# Patient Record
Sex: Female | Born: 1980 | Race: White | Hispanic: No | Marital: Married | Smoking: Never smoker
Health system: Southern US, Community
[De-identification: ages and names within clinical notes are randomized; demographics above are authoritative.]

## PROBLEM LIST (undated history)

## (undated) ENCOUNTER — Inpatient Hospital Stay (HOSPITAL_COMMUNITY): Payer: Self-pay

## (undated) DIAGNOSIS — Z789 Other specified health status: Secondary | ICD-10-CM

## (undated) HISTORY — PX: DILATION AND CURETTAGE OF UTERUS: SHX78

## (undated) HISTORY — PX: TONSILLECTOMY: SUR1361

---

## 2012-04-04 ENCOUNTER — Inpatient Hospital Stay (HOSPITAL_COMMUNITY): Payer: Self-pay

## 2012-04-04 ENCOUNTER — Inpatient Hospital Stay (HOSPITAL_COMMUNITY)
Admission: AD | Admit: 2012-04-04 | Discharge: 2012-04-04 | Disposition: A | Discharge status: Home / Self Care | Payer: Self-pay | Source: Ambulatory Visit | Attending: Obstetrics & Gynecology | Admitting: Obstetrics & Gynecology

## 2012-04-04 ENCOUNTER — Encounter (HOSPITAL_COMMUNITY): Payer: Self-pay | Admitting: *Deleted

## 2012-04-04 DIAGNOSIS — O209 Hemorrhage in early pregnancy, unspecified: Secondary | ICD-10-CM | POA: Insufficient documentation

## 2012-04-04 DIAGNOSIS — O4100X0 Oligohydramnios, unspecified trimester, not applicable or unspecified: Secondary | ICD-10-CM

## 2012-04-04 LAB — CBC
HCT: 38.9 % (ref 36.0–46.0)
MCHC: 33.7 g/dL (ref 30.0–36.0)
RDW: 12.9 % (ref 11.5–15.5)

## 2012-04-04 NOTE — MAU Note (Signed)
PT SAYS AT [facility]-  HER CHILD CAME IN ROOM -   THEN PT WENT TO B-ROOM  THEN SHE HAD GUSH OF FLUID IN TOILET.  THEN BLEEDING STARTED AT  0030-.    THEN CRAMPING STARTED  AT  0100.    IN ROOM - ON PAD - DARK RED. SMALL AMT

## 2012-04-04 NOTE — MAU Note (Signed)
Leaking fld since 2400. Then started bleeding with some clots. Slight cramping since leaking started

## 2012-04-04 NOTE — MAU Provider Note (Signed)
History     CSN: [id number]  Arrival date and time: 04/04/12 0100   First Provider Initiated Contact with Patient 04/04/12 0150      Chief Complaint  Patient presents with  . Rupture of Membranes  . Vaginal Bleeding   HPI  Pt is a G4P0121 at 18.3 wks IUP here with report of a gush of fluid noted while in bed.  Went to sit on toilet and experienced vaginal bleeding.  Reports passing clots.  No report of pelvic pain.    Past Medical History  Diagnosis Date  . Preterm labor     Past Surgical History  Procedure Date  . Tonsillectomy   . Dilation and curettage of uterus     History reviewed. No pertinent family history.  History  Substance Use Topics  . Smoking status: Never Smoker   . Smokeless tobacco: Not on file  . Alcohol Use: No    Allergies: No Known Allergies  Prescriptions prior to admission  Medication Sig Dispense Refill  . Multiple Vitamin (MULTIVITAMIN) tablet Take 1 tablet by mouth daily.        Review of Systems  Genitourinary:       Vaginal bleeding; leaking of fluid  All other systems reviewed and are negative.   Physical Exam   Filed Vitals:   04/04/12 0118 04/04/12 0353  BP: 150/82 123/73  Pulse: 99 88  Temp: 97.9 F (36.6 C)   Resp: 22   Height: 5\' 10"  (1.778 m)   Weight: 109.68 kg (241 lb 12.8 oz)     Blood pressure 150/82, pulse 99, temperature 97.9 F (36.6 C), resp. rate 22, height 5\' 10"  (1.778 m), weight 109.68 kg (241 lb 12.8 oz).  Physical Exam  Constitutional: She is oriented to person, place, and time. She appears well-developed and well-nourished. No distress.       Uncomfortable appearing  HENT:  Head: Normocephalic.  Neck: Normal range of motion. Neck supple.  Cardiovascular: Normal rate, regular rhythm and normal heart sounds.   Respiratory: Effort normal and breath sounds normal. No respiratory distress.  GI: Soft. She exhibits no mass. There is no tenderness. There is no rebound and no guarding.    Genitourinary: There is bleeding (moderate; +clots) around the vagina.  Musculoskeletal: Normal range of motion. She exhibits no edema.  Neurological: She is alert and oriented to person, place, and time.  Skin: Skin is warm and dry.    MAU Course  Procedures Results for orders placed during the hospital encounter of 04/04/12 (from the past 24 hour(s))  CBC     Status: Normal   Collection Time   04/04/12  2:30 AM      Component Value Range   WBC 8.2  4.0 - 10.5 K/uL   RBC 4.29  3.87 - 5.11 MIL/uL   Hemoglobin 13.1  12.0 - 15.0 g/dL   HCT 40.9  81.1 - 91.4 %   MCV 90.7  78.0 - 100.0 fL   MCH 30.5  26.0 - 34.0 pg   MCHC 33.7  30.0 - 36.0 g/dL   RDW 78.2  95.6 - 21.3 %   Platelets 178  150 - 400 K/uL  ABO/RH     Status: Normal (Preliminary result)   Collection Time   04/04/12  2:30 AM      Component Value Range   ABO/RH(D) O POS     Ultrasound: IMPRESSION: Anhydramnios noted, compatible with premature rupture of membranes. No evidence for placental abruption. The biparietal diameter  of 4.1 cm corresponds to a gestational age of [redacted] weeks 3 days.  Consulted with Dr. Arlyce Dice > Reviewed HPI/exam/ultrasound results > discussed potential outcome with pt via phone > DC pt home with follow-up either tomorrow or Monday in office.  Assessment and Plan  Anhydramnios in Pregnancy Vaginal Bleeding in Pregnancy  Plan: DC to home Pt will consult with husband Banker) and will decide when to return for plan of care. Bleeding precautions.  [facility] 04/04/2012, 1:57 AM

## 2012-04-05 ENCOUNTER — Encounter (HOSPITAL_COMMUNITY): Payer: Self-pay | Admitting: *Deleted

## 2012-04-05 ENCOUNTER — Ambulatory Visit (HOSPITAL_COMMUNITY)
Admission: RE | Admit: 2012-04-05 | Discharge: 2012-04-05 | Disposition: A | Discharge status: Home / Self Care | Payer: Self-pay | Source: Ambulatory Visit | Attending: Obstetrics and Gynecology | Admitting: Obstetrics and Gynecology

## 2012-04-05 ENCOUNTER — Other Ambulatory Visit (HOSPITAL_COMMUNITY): Payer: Self-pay | Admitting: Obstetrics and Gynecology

## 2012-04-05 ENCOUNTER — Encounter (HOSPITAL_COMMUNITY): Payer: Self-pay

## 2012-04-05 ENCOUNTER — Other Ambulatory Visit: Payer: Self-pay | Admitting: Obstetrics and Gynecology

## 2012-04-05 DIAGNOSIS — O4290 Premature rupture of membranes, unspecified as to length of time between rupture and onset of labor, unspecified weeks of gestation: Secondary | ICD-10-CM | POA: Insufficient documentation

## 2012-04-05 DIAGNOSIS — O2620 Pregnancy care for patient with recurrent pregnancy loss, unspecified trimester: Secondary | ICD-10-CM | POA: Insufficient documentation

## 2012-04-05 DIAGNOSIS — O3680X0 Pregnancy with inconclusive fetal viability, not applicable or unspecified: Secondary | ICD-10-CM

## 2012-04-05 DIAGNOSIS — Z8751 Personal history of pre-term labor: Secondary | ICD-10-CM | POA: Insufficient documentation

## 2012-04-05 NOTE — Consult Note (Signed)
MFM Note  Jamie Gregory is a 32 year old G21P1A2 Caucasian female at 18+weeks who presents for consultation due to spontaneous rupture of membranes which occurred yesterday. She reports feeling a "gush" of fluid with some minimal vaginal bleeding. She presented to the MAU for evaluation and PPROM was confirmed. US revealed no measurable pocket of fluid. She was discharged home and has had no s/s of intrauterine infection.  We reviewed her OB history which is significant for one delivery at 35 weeks after SROM. She also has had 2 early SABs with D&Cs.  The management of midterm PPROM was discussed in detail. Her options at this time would be expectant management verses termination. If she chooses expectant management, she could be followed at home with strict precautions for the s/s of infection. At viability, she would be admitted for closer observation, BMZ and antibiotics. Delivery is recommended at 34 weeks. Fetal risks include pulmonary hypoplasia, skeletal deformities and prematurity. Maternal risks include intrauterine infection, sepsis and death.  After our discussion, an ultrasound was performed which revealed a fetal demise. Induction and D&E were reviewed including risks and benefits of both. Jamie Gregory needed more time before she made a final decision. She was given our contact numbers.  Thank you for allowing me to be apart of her prenatal care.  (Face-to-face consultation with patient: 30 min)

## 2012-04-06 ENCOUNTER — Ambulatory Visit (HOSPITAL_COMMUNITY)
Admission: RE | Admit: 2012-04-06 | Discharge: 2012-04-06 | Disposition: A | Discharge status: Home / Self Care | Payer: 59 | Source: Ambulatory Visit | Attending: Obstetrics and Gynecology | Admitting: Obstetrics and Gynecology

## 2012-04-06 ENCOUNTER — Encounter (HOSPITAL_COMMUNITY)
Admission: RE | Disposition: A | Discharge status: Home / Self Care | Payer: Self-pay | Source: Ambulatory Visit | Attending: Obstetrics and Gynecology

## 2012-04-06 ENCOUNTER — Encounter (HOSPITAL_COMMUNITY): Payer: Self-pay | Admitting: Anesthesiology

## 2012-04-06 ENCOUNTER — Ambulatory Visit (HOSPITAL_COMMUNITY): Payer: 59 | Admitting: Anesthesiology

## 2012-04-06 ENCOUNTER — Encounter (HOSPITAL_COMMUNITY): Payer: Self-pay | Admitting: Pharmacy Technician

## 2012-04-06 DIAGNOSIS — IMO0002 Reserved for concepts with insufficient information to code with codable children: Secondary | ICD-10-CM

## 2012-04-06 DIAGNOSIS — O021 Missed abortion: Secondary | ICD-10-CM | POA: Insufficient documentation

## 2012-04-06 HISTORY — DX: Other specified health status: Z78.9

## 2012-04-06 HISTORY — PX: DILATION AND EVACUATION: SHX1459

## 2012-04-06 SURGERY — DILATION AND EVACUATION, UTERUS, SECOND TRIMESTER
Anesthesia: General | Site: Vagina | Wound class: Clean Contaminated

## 2012-04-06 MED ORDER — PROPOFOL 10 MG/ML IV EMUL
INTRAVENOUS | Status: AC
  Filled 2012-04-06: qty 20

## 2012-04-06 MED ORDER — MISOPROSTOL 200 MCG PO TABS
ORAL_TABLET | ORAL | Status: AC
  Filled 2012-04-06: qty 4

## 2012-04-06 MED ORDER — OXYCODONE-ACETAMINOPHEN 5-325 MG PO TABS: 1.0000 | ORAL_TABLET | ORAL | Status: DC | PRN

## 2012-04-06 MED ORDER — CEFAZOLIN SODIUM-DEXTROSE 2-3 GM-% IV SOLR
INTRAVENOUS | Status: AC
  Filled 2012-04-06: qty 50

## 2012-04-06 MED ORDER — OXYCODONE-ACETAMINOPHEN 5-325 MG PO TABS
ORAL_TABLET | ORAL | Status: AC
  Administered 2012-04-06: 1 via ORAL
  Filled 2012-04-06: qty 1

## 2012-04-06 MED ORDER — PROPOFOL 10 MG/ML IV EMUL
INTRAVENOUS | Status: DC | PRN
  Administered 2012-04-06: 200 mg via INTRAVENOUS

## 2012-04-06 MED ORDER — MIDAZOLAM HCL 2 MG/2ML IJ SOLN
INTRAMUSCULAR | Status: AC
  Filled 2012-04-06: qty 2

## 2012-04-06 MED ORDER — MIDAZOLAM HCL 5 MG/5ML IJ SOLN
INTRAMUSCULAR | Status: DC | PRN
  Administered 2012-04-06: 2 mg via INTRAVENOUS

## 2012-04-06 MED ORDER — OXYCODONE-ACETAMINOPHEN 5-325 MG PO TABS
1.0000 | ORAL_TABLET | Freq: Once | ORAL | Status: AC
  Administered 2012-04-06: 1 via ORAL

## 2012-04-06 MED ORDER — LACTATED RINGERS IV SOLN
INTRAVENOUS | Status: DC
  Administered 2012-04-06 (×2): via INTRAVENOUS

## 2012-04-06 MED ORDER — METHYLERGONOVINE MALEATE 0.2 MG/ML IJ SOLN
INTRAMUSCULAR | Status: DC | PRN
  Administered 2012-04-06: 0.2 mg via INTRAMUSCULAR

## 2012-04-06 MED ORDER — DEXAMETHASONE SODIUM PHOSPHATE 4 MG/ML IJ SOLN
INTRAMUSCULAR | Status: DC | PRN
  Administered 2012-04-06: 5 mg via INTRAVENOUS

## 2012-04-06 MED ORDER — ONDANSETRON HCL 4 MG/2ML IJ SOLN
INTRAMUSCULAR | Status: DC | PRN
  Administered 2012-04-06: 4 mg via INTRAVENOUS

## 2012-04-06 MED ORDER — CEFAZOLIN SODIUM 1-5 GM-% IV SOLN
1.0000 g | INTRAVENOUS | Status: DC
  Filled 2012-04-06: qty 50

## 2012-04-06 MED ORDER — FENTANYL CITRATE 0.05 MG/ML IJ SOLN
INTRAMUSCULAR | Status: DC | PRN
  Administered 2012-04-06: 100 ug via INTRAVENOUS
  Administered 2012-04-06: 50 ug via INTRAVENOUS

## 2012-04-06 MED ORDER — METHYLERGONOVINE MALEATE 0.2 MG PO TABS: 0.2000 mg | ORAL_TABLET | Freq: Four times a day (QID) | ORAL | Status: DC

## 2012-04-06 MED ORDER — LIDOCAINE HCL (CARDIAC) 20 MG/ML IV SOLN
INTRAVENOUS | Status: DC | PRN
  Administered 2012-04-06: 80 mg via INTRAVENOUS

## 2012-04-06 MED ORDER — CEFAZOLIN SODIUM-DEXTROSE 2-3 GM-% IV SOLR
2.0000 g | Freq: Once | INTRAVENOUS | Status: AC
  Administered 2012-04-06: 2 g via INTRAVENOUS

## 2012-04-06 MED ORDER — DEXAMETHASONE SODIUM PHOSPHATE 10 MG/ML IJ SOLN
INTRAMUSCULAR | Status: AC
  Filled 2012-04-06: qty 1

## 2012-04-06 MED ORDER — FENTANYL CITRATE 0.05 MG/ML IJ SOLN
INTRAMUSCULAR | Status: AC
  Filled 2012-04-06: qty 5

## 2012-04-06 MED ORDER — LIDOCAINE HCL (CARDIAC) 20 MG/ML IV SOLN
INTRAVENOUS | Status: AC
  Filled 2012-04-06: qty 5

## 2012-04-06 MED ORDER — KETOROLAC TROMETHAMINE 30 MG/ML IJ SOLN
INTRAMUSCULAR | Status: AC
  Filled 2012-04-06: qty 1

## 2012-04-06 MED ORDER — ONDANSETRON HCL 4 MG/2ML IJ SOLN: INTRAMUSCULAR | Status: DC | PRN

## 2012-04-06 MED ORDER — KETOROLAC TROMETHAMINE 30 MG/ML IJ SOLN
INTRAMUSCULAR | Status: DC | PRN
  Administered 2012-04-06: 30 mg via INTRAVENOUS

## 2012-04-06 MED ORDER — MISOPROSTOL 200 MCG PO TABS
800.0000 ug | ORAL_TABLET | Freq: Once | ORAL | Status: AC
  Administered 2012-04-06: 800 ug via VAGINAL

## 2012-04-06 SURGICAL SUPPLY — 18 items
CLOTH BEACON ORANGE TIMEOUT ST (SAFETY) ×2 IMPLANT
CONT PATH 16OZ SNAP LID 3702 (MISCELLANEOUS) ×2 IMPLANT
DRAPE HYSTEROSCOPY (DRAPE) ×2 IMPLANT
GLOVE ECLIPSE 7.0 STRL STRAW (GLOVE) ×4 IMPLANT
GOWN PREVENTION PLUS XLARGE (GOWN DISPOSABLE) ×2 IMPLANT
GOWN STRL REIN XL XLG (GOWN DISPOSABLE) ×4 IMPLANT
KIT BERKELEY 2ND TRIMESTER 1/2 (COLLECTOR) ×2 IMPLANT
NEEDLE SPNL 22GX3.5 QUINCKE BK (NEEDLE) ×2 IMPLANT
NS IRRIG 1000ML POUR BTL (IV SOLUTION) ×2 IMPLANT
PACK VAGINAL MINOR WOMEN LF (CUSTOM PROCEDURE TRAY) ×2 IMPLANT
PAD OB MATERNITY 4.3X12.25 (PERSONAL CARE ITEMS) ×2 IMPLANT
PAD PREP 24X48 CUFFED NSTRL (MISCELLANEOUS) ×2 IMPLANT
SYR CONTROL 10ML LL (SYRINGE) ×2 IMPLANT
TOWEL OR 17X24 6PK STRL BLUE (TOWEL DISPOSABLE) ×4 IMPLANT
TUBE VACURETTE 2ND TRIMESTER (CANNULA) ×2 IMPLANT
VACURETTE 12 RIGID CVD (CANNULA) IMPLANT
VACURETTE 14MM CVD 1/2 BASE (CANNULA) ×2 IMPLANT
VACURETTE 16MM ASPIR CVD .5 (CANNULA) IMPLANT

## 2012-04-06 NOTE — H&P (Signed)
Pt is a 32 year old white female, [id number] who presents to the OR for a D&E secondary to PPROM with fetal demise. PE: VSSAF        ABD-soft, non tender        Pelvic- defered to the OR. IMP/ 18 week PPROM with fetal demise. PLAN/ D&E

## 2012-04-06 NOTE — Op Note (Signed)
Jamie Gregory, Jamie NO.:  Gregory  MEDICAL RECORD NO.:  [id number]  LOCATION:  [facility]                          FACILITY:  [facility]  PHYSICIAN:  Malva Limes, M.D.    DATE OF BIRTH:  Mar 18, 1980  DATE OF PROCEDURE:  04/06/2012 DATE OF DISCHARGE:  04/06/2012                              OPERATIVE REPORT   PREOPERATIVE DIAGNOSES: 1. Premature rupture of membranes 2. Fetal demise.  POSTOPERATIVE DIAGNOSES: 1. Premature rupture of membranes. 2. Fetal demise.  PROCEDURE:  Dilation evacuation.  SURGEON:  Malva Limes, M.D.  ANESTHESIA:  General with local antibiotics, Ancef 2 g.  DRAINS:  Red rubber catheter bladder.  SPECIMENS:  Products of conception sent to pathology, and for chromosomal analysis.  ESTIMATED BLOOD LOSS:  125 mL.  COMPLICATIONS:  None.  PROCEDURE IN DETAIL:  The patient was taken to the operating room.  She was placed in dorsal supine position and general anesthetic was administered without difficulty.  She was then placed in dorsal lithotomy position.  She was prepped and draped in usual fashion for this procedure.  Her bladder was drained with a red rubber catheter. The patient previously had a Cytotec placed in her vagina, the remaining pellet was removed.  An exam under anesthesia revealed the uterus approximately 16 weeks in size.  Sterile speculum placed in vagina. Single-tooth tenaculum was applied to the anterior cervical lip.  The cervix was serially dilated to a 47-French.  The long forceps were then used to remove the fetus and placenta.  Once this was accomplished, suction curettage was performed followed by sharp curettage and repeat suction.  There was minimal bleeding noted during this procedure.  The patient was taken to recovery room in stable condition.  Her blood type is Rh positive, and therefore, no RhoGAM is indicated.  We will await the results of a chromosome analysis.  The patient will be discharged home.  She  will be sent home with Methergine 0.2 mg p.o. q.6 h. x6 and Percocet to take p.r.n.  She will follow up in the office in 2 weeks.          ______________________________ Malva Limes, M.D.     MA/MEDQ  D:  04/06/2012  T:  04/06/2012  Job:  [id number]

## 2012-04-06 NOTE — Anesthesia Preprocedure Evaluation (Signed)
Anesthesia Evaluation  Patient identified by MRN, date of birth, ID band Patient awake    Reviewed: Allergy & Precautions, H&P , NPO status , Patient's Chart, lab work & pertinent test results  Airway Mallampati: II TM Distance: >3 FB Neck ROM: full    Dental No notable dental hx. (+) Teeth Intact   Pulmonary neg pulmonary ROS,          Cardiovascular negative cardio ROS      Neuro/Psych negative neurological ROS  negative psych ROS   GI/Hepatic negative GI ROS, Neg liver ROS,   Endo/Other  negative endocrine ROS  Renal/GU negative Renal ROS  negative genitourinary   Musculoskeletal negative musculoskeletal ROS (+)   Abdominal Normal abdominal exam  (+)   Peds negative pediatric ROS (+)  Hematology negative hematology ROS (+)   Anesthesia Other Findings   Reproductive/Obstetrics negative OB ROS                           Anesthesia Physical Anesthesia Plan  ASA: II  Anesthesia Plan: General   Post-op Pain Management:    Induction: Intravenous  Airway Management Planned: LMA  Additional Equipment:   Intra-op Plan:   Post-operative Plan:   Informed Consent: I have reviewed the patients History and Physical, chart, labs and discussed the procedure including the risks, benefits and alternatives for the proposed anesthesia with the patient or authorized representative who has indicated his/her understanding and acceptance.     Plan Discussed with: CRNA and Surgeon  Anesthesia Plan Comments:         Anesthesia Quick Evaluation

## 2012-04-06 NOTE — Transfer of Care (Signed)
Immediate Anesthesia Transfer of Care Note  Patient: Jamie Gregory  Procedure(s) Performed: Procedure(s) (LRB) with comments: DILATATION AND EVACUATION (D&E) 2ND TRIMESTER (N/A)  Patient Location: PACU  Anesthesia Type:General  Level of Consciousness: awake, alert  and oriented  Airway & Oxygen Therapy: Patient Spontanous Breathing and Patient connected to nasal cannula oxygen  Post-op Assessment: Report given to PACU RN and Post -op Vital signs reviewed and stable  Post vital signs: stable  Complications: No apparent anesthesia complications

## 2012-04-06 NOTE — Anesthesia Postprocedure Evaluation (Signed)
  Anesthesia Post-op Note  Patient: Jamie Gregory  Procedure(s) Performed: Procedure(s) (LRB) with comments: DILATATION AND EVACUATION (D&E) 2ND TRIMESTER (N/A)  Patient Location: PACU  Anesthesia Type:General  Level of Consciousness: awake, alert  and oriented  Airway and Oxygen Therapy: Patient Spontanous Breathing  Post-op Pain: none  Post-op Assessment: Post-op Vital signs reviewed, Patient's Cardiovascular Status Stable, Respiratory Function Stable, Patent Airway, No signs of Nausea or vomiting and Pain level controlled  Post-op Vital Signs: Reviewed and stable  Complications: No apparent anesthesia complications

## 2012-04-07 ENCOUNTER — Encounter (HOSPITAL_COMMUNITY): Payer: Self-pay | Admitting: Obstetrics and Gynecology

## 2012-09-14 ENCOUNTER — Telehealth (HOSPITAL_COMMUNITY): Payer: Self-pay | Admitting: *Deleted

## 2012-09-14 ENCOUNTER — Encounter (HOSPITAL_COMMUNITY): Payer: Self-pay | Admitting: *Deleted

## 2012-09-14 NOTE — Telephone Encounter (Signed)
Resolve episode 

## 2013-04-11 ENCOUNTER — Emergency Department (HOSPITAL_COMMUNITY)
Admission: EM | Admit: 2013-04-11 | Discharge: 2013-04-11 | Disposition: A | Discharge status: Home / Self Care | Payer: 59 | Attending: Emergency Medicine | Admitting: Emergency Medicine

## 2013-04-11 ENCOUNTER — Encounter (HOSPITAL_COMMUNITY): Payer: Self-pay | Admitting: Emergency Medicine

## 2013-04-11 DIAGNOSIS — Z8751 Personal history of pre-term labor: Secondary | ICD-10-CM | POA: Diagnosis not present

## 2013-04-11 DIAGNOSIS — S59919A Unspecified injury of unspecified forearm, initial encounter: Secondary | ICD-10-CM | POA: Diagnosis not present

## 2013-04-11 DIAGNOSIS — S59909A Unspecified injury of unspecified elbow, initial encounter: Secondary | ICD-10-CM | POA: Diagnosis not present

## 2013-04-11 DIAGNOSIS — Z3202 Encounter for pregnancy test, result negative: Secondary | ICD-10-CM | POA: Insufficient documentation

## 2013-04-11 DIAGNOSIS — Z79899 Other long term (current) drug therapy: Secondary | ICD-10-CM | POA: Insufficient documentation

## 2013-04-11 DIAGNOSIS — S4980XA Other specified injuries of shoulder and upper arm, unspecified arm, initial encounter: Secondary | ICD-10-CM | POA: Diagnosis not present

## 2013-04-11 DIAGNOSIS — IMO0002 Reserved for concepts with insufficient information to code with codable children: Secondary | ICD-10-CM | POA: Insufficient documentation

## 2013-04-11 DIAGNOSIS — S46909A Unspecified injury of unspecified muscle, fascia and tendon at shoulder and upper arm level, unspecified arm, initial encounter: Secondary | ICD-10-CM | POA: Insufficient documentation

## 2013-04-11 DIAGNOSIS — Y9389 Activity, other specified: Secondary | ICD-10-CM | POA: Diagnosis not present

## 2013-04-11 DIAGNOSIS — S0993XA Unspecified injury of face, initial encounter: Secondary | ICD-10-CM | POA: Diagnosis present

## 2013-04-11 DIAGNOSIS — S6990XA Unspecified injury of unspecified wrist, hand and finger(s), initial encounter: Secondary | ICD-10-CM | POA: Insufficient documentation

## 2013-04-11 DIAGNOSIS — T148XXA Other injury of unspecified body region, initial encounter: Secondary | ICD-10-CM

## 2013-04-11 DIAGNOSIS — S199XXA Unspecified injury of neck, initial encounter: Secondary | ICD-10-CM | POA: Diagnosis present

## 2013-04-11 DIAGNOSIS — Y92410 Unspecified street and highway as the place of occurrence of the external cause: Secondary | ICD-10-CM | POA: Insufficient documentation

## 2013-04-11 LAB — POCT PREGNANCY, URINE: PREG TEST UR: NEGATIVE

## 2013-04-11 MED ORDER — METAXALONE 800 MG PO TABS: 800.0000 mg | ORAL_TABLET | Freq: Three times a day (TID) | ORAL | Status: AC

## 2013-04-11 MED ORDER — IBUPROFEN 200 MG PO TABS
600.0000 mg | ORAL_TABLET | Freq: Once | ORAL | Status: AC
  Administered 2013-04-11: 600 mg via ORAL
  Filled 2013-04-11: qty 3

## 2013-04-11 MED ORDER — IBUPROFEN 600 MG PO TABS: 600.0000 mg | ORAL_TABLET | Freq: Four times a day (QID) | ORAL | Status: AC | PRN

## 2013-04-11 MED ORDER — METAXALONE 800 MG PO TABS
800.0000 mg | ORAL_TABLET | Freq: Once | ORAL | Status: AC
  Administered 2013-04-11: 800 mg via ORAL
  Filled 2013-04-11 (×2): qty 1
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
## 2013-04-15 NOTE — ED Provider Notes (Signed)
Medical screening examination/treatment/procedure(s) were performed by non-physician practitioner and as supervising physician I was immediately available for consultation/collaboration.  Tekoa Amon M Menelik Mcfarren, MD 04/15/13 1951 

## 2014-01-09 ENCOUNTER — Encounter (HOSPITAL_COMMUNITY): Payer: Self-pay | Admitting: Emergency Medicine
